# Patient Record
Sex: Female | Born: 1980 | Hispanic: No | Marital: Married | Smoking: Never smoker
Health system: Southern US, Community
[De-identification: ages and names within clinical notes are randomized; demographics above are authoritative.]

## PROBLEM LIST (undated history)

## (undated) HISTORY — PX: CHOLECYSTECTOMY: SHX55

---

## 2013-08-27 ENCOUNTER — Emergency Department (HOSPITAL_COMMUNITY)
Admission: EM | Admit: 2013-08-27 | Discharge: 2013-08-27 | Disposition: A | Discharge status: Home / Self Care | Payer: No Typology Code available for payment source | Source: Home / Self Care | Attending: Family Medicine | Admitting: Family Medicine

## 2013-08-27 ENCOUNTER — Encounter (HOSPITAL_COMMUNITY): Payer: Self-pay | Admitting: Emergency Medicine

## 2013-08-27 DIAGNOSIS — R519 Headache, unspecified: Secondary | ICD-10-CM

## 2013-08-27 DIAGNOSIS — R51 Headache: Secondary | ICD-10-CM

## 2013-08-27 MED ORDER — DEXAMETHASONE 1 MG/ML PO CONC
10.0000 mg | Freq: Once | ORAL | Status: AC
  Administered 2013-08-27: 10 mg via ORAL

## 2013-08-27 MED ORDER — SUMATRIPTAN SUCCINATE 6 MG/0.5ML ~~LOC~~ SOLN
6.0000 mg | Freq: Once | SUBCUTANEOUS | Status: AC
  Administered 2013-08-27: 6 mg via SUBCUTANEOUS

## 2013-08-27 MED ORDER — DEXAMETHASONE 10 MG/ML FOR PEDIATRIC ORAL USE
INTRAMUSCULAR | Status: AC
  Filled 2013-08-27: qty 1

## 2013-08-27 MED ORDER — SUMATRIPTAN SUCCINATE 6 MG/0.5ML ~~LOC~~ SOLN
SUBCUTANEOUS | Status: AC
  Filled 2013-08-27: qty 0.5

## 2013-08-27 NOTE — ED Notes (Addendum)
C/o headache and dizzines Denies any medication taking Denies any vomiting

## 2013-08-27 NOTE — Discharge Instructions (Signed)
You are likely suffering from a migraine.  This should improve significantly from the medications given in our clinic Please use tylenol and ibuprofen for future headaches. Tylenol 1000mg  every 8 hours and ibuprofen 600-800mg  every 6-8hours Please come back if your symptoms get worse.   Migraine Headache A migraine headache is an intense, throbbing pain on one or both sides of your head. A migraine can last for 30 minutes to several hours. CAUSES  The exact cause of a migraine headache is not always known. However, a migraine may be caused when nerves in the brain become irritated and release chemicals that cause inflammation. This causes pain. Certain things may also trigger migraines, such as:  Alcohol.  Smoking.  Stress.  Menstruation.  Aged cheeses.  Foods or drinks that contain nitrates, glutamate, aspartame, or tyramine.  Lack of sleep.  Chocolate.  Caffeine.  Hunger.  Physical exertion.  Fatigue.  Medicines used to treat chest pain (nitroglycerine), birth control pills, estrogen, and some blood pressure medicines. SIGNS AND SYMPTOMS  Pain on one or both sides of your head.  Pulsating or throbbing pain.  Severe pain that prevents daily activities.  Pain that is aggravated by any physical activity.  Nausea, vomiting, or both.  Dizziness.  Pain with exposure to bright lights, loud noises, or activity.  General sensitivity to bright lights, loud noises, or smells. Before you get a migraine, you may get warning signs that a migraine is coming (aura). An aura may include:  Seeing flashing lights.  Seeing bright spots, halos, or zigzag lines.  Having tunnel vision or blurred vision.  Having feelings of numbness or tingling.  Having trouble talking.  Having muscle weakness. DIAGNOSIS  A migraine headache is often diagnosed based on:  Symptoms.  Physical exam.  A CT scan or MRI of your head. These imaging tests cannot diagnose migraines, but they  can help rule out other causes of headaches. TREATMENT Medicines may be given for pain and nausea. Medicines can also be given to help prevent recurrent migraines.  HOME CARE INSTRUCTIONS  Only take over-the-counter or prescription medicines for pain or discomfort as directed by your health care provider. The use of long-term narcotics is not recommended.  Lie down in a dark, quiet room when you have a migraine.  Keep a journal to find out what may trigger your migraine headaches. For example, write down:  What you eat and drink.  How much sleep you get.  Any change to your diet or medicines.  Limit alcohol consumption.  Quit smoking if you smoke.  Get 7-9 hours of sleep, or as recommended by your health care provider.  Limit stress.  Keep lights dim if bright lights bother you and make your migraines worse. SEEK IMMEDIATE MEDICAL CARE IF:   Your migraine becomes severe.  You have a fever.  You have a stiff neck.  You have vision loss.  You have muscular weakness or loss of muscle control.  You start losing your balance or have trouble walking.  You feel faint or pass out.  You have severe symptoms that are different from your first symptoms. MAKE SURE YOU:   Understand these instructions.  Will watch your condition.  Will get help right away if you are not doing well or get worse. Document Released: 01/18/2005 Document Revised: 06/04/2013 Document Reviewed: 09/25/2012 [facility] Ltd[facility] Patient Information 2015 [place], [place]. This information is not intended to replace advice given to you by your health care provider. Make sure you discuss any questions  you have with your health care provider.

## 2013-08-27 NOTE — ED Provider Notes (Signed)
CSN: [id number]     Arrival date & time 08/27/13  1122 History   First MD Initiated Contact with Patient 08/27/13 1232     Chief Complaint  Patient presents with  . Headache   (Consider location/radiation/quality/duration/timing/severity/associated sxs/prior Treatment) HPI  HA: started 7 days ago. Typically in the temples. Alternating from R-L. Comes and goes. Does not wake at night. Unsure of what may make the HA worse. Improves w/ rest. Has not tried any medications. Associated w/ intermittent dizziness. Denies nausea, vomting, fever, rash, syncope, lightheadedness, falls, change in vision. H/o HA similar to this in the past.   History reviewed. No pertinent past medical history. Past Surgical History  Procedure Laterality Date  . Cholecystectomy     No family history on file. History  Substance Use Topics  . Smoking status: Never Smoker   . Smokeless tobacco: Not on file  . Alcohol Use: No   OB History   Grav Para Term Preterm Abortions TAB SAB Ect Mult Living                 Review of Systems Per HPI with all other pertinent systems negative.   Allergies  Review of patient's allergies indicates no known allergies.  Home Medications   Prior to Admission medications   Not on File   BP 136/72  Pulse 87  Temp(Src) 98.8 F (37.1 C) (Oral)  Resp 16  Ht 5\' 2"  (1.575 m)  Wt 172 lb (78.019 kg)  BMI 31.45 kg/m2  SpO2 99%  LMP 08/07/2013 Physical Exam  Constitutional: She is oriented to person, place, and time. She appears well-developed and well-nourished. No distress.  HENT:  Head: Normocephalic and atraumatic.  Eyes: EOM are normal. Pupils are equal, round, and reactive to light.  Neck: Normal range of motion.  Cardiovascular: Normal rate, regular rhythm, normal heart sounds and intact distal pulses.   No murmur heard. Pulmonary/Chest: Effort normal. No respiratory distress.  Abdominal: Bowel sounds are normal.  Musculoskeletal: Normal range of motion. She  exhibits no tenderness.  Neurological: She is alert and oriented to person, place, and time. No cranial nerve deficit. She exhibits normal muscle tone. Coordination normal.  Skin: Skin is warm and dry. No rash noted. She is not diaphoretic. No erythema. No pallor.  Psychiatric: She has a normal mood and affect. Her behavior is normal. Judgment and thought content normal.    ED Course  Procedures (including critical care time) Labs Review Labs Reviewed - No data to display  Imaging Review No results found.   MDM   1. Acute nonintractable headache, unspecified headache type    HA: likely migraine type. No sign of acute emergent intracranial process.  imitrex and decadron in clinic.   Tylenol and ibuprofen recommended PRN at home - counseled pt to try these therapies in the future as abortive measures at the onset of HA.  Precautions given and all questions answered   Shelly Flattenavid Merrell, MD Family Medicine 08/27/2013, 1:06 PM      Ozella Rocksavid J Merrell, MD 08/27/13 [phone]

## 2013-09-18 ENCOUNTER — Encounter: Payer: Self-pay | Admitting: Family Medicine

## 2013-09-18 ENCOUNTER — Ambulatory Visit (INDEPENDENT_AMBULATORY_CARE_PROVIDER_SITE_OTHER): Payer: No Typology Code available for payment source | Admitting: Family Medicine

## 2013-09-18 VITALS — BP 135/77 | HR 90 | Temp 98.4°F | Resp 14 | Ht 62.0 in | Wt 174.0 lb

## 2013-09-18 DIAGNOSIS — E8881 Metabolic syndrome: Secondary | ICD-10-CM

## 2013-09-18 DIAGNOSIS — Z789 Other specified health status: Secondary | ICD-10-CM | POA: Insufficient documentation

## 2013-09-18 DIAGNOSIS — N939 Abnormal uterine and vaginal bleeding, unspecified: Secondary | ICD-10-CM

## 2013-09-18 DIAGNOSIS — N926 Irregular menstruation, unspecified: Secondary | ICD-10-CM

## 2013-09-18 DIAGNOSIS — R109 Unspecified abdominal pain: Secondary | ICD-10-CM

## 2013-09-18 DIAGNOSIS — E669 Obesity, unspecified: Secondary | ICD-10-CM

## 2013-09-18 NOTE — Patient Instructions (Signed)
Start 1800 Calorie lowfat, low sodium diet divided over 6 small meals Increase water intake to 6-8 glasses daily Start aerobic exercise daily for 30 minutes 5 days per week Continue OTC Multivitamin (One A Day for Women) Continue daily folic acid

## 2013-09-18 NOTE — Progress Notes (Signed)
Subjective:    Patient ID: Lauren Whitehead, female    DOB: 08/12/1980, 33 y.o.   MRN: [id number]  HPI  Lauren Whitehead presents accompanied by an interpreter to establish care.  Patient states that she was followed by [facility] in [place] prior to moving to [place] 4 months ago. Her last complete physical exam with pap smear was in May.    Patient complains of irregular menstrual periods. Symptoms began several years ago. Patient describes symptoms of  bloating/fluid retention (mild) and pelvic pain (mild). Menstrual cycles range from 40 to 45 days apart.  Patient denies anxiety, breast tenderness, decreased libido, depression, dyspareunia, insomnia, labile mood and menstrual cramping. Evaluation to date includes GYN evaluation (normal). The patient is sexually active.    Patient alsocomplains of abdominal discomfort. The pain is located in the entire abdomen. The discomfort is described as bloating and occurs intermittently. Onset was several months ago. Symptoms have been improving. Aggravating factors include menstrual cycles and eating heavy meals. The patient denies fever, nausea, vomiting , and diarrhea.    Review of Systems  Constitutional: Positive for unexpected weight change (weight gain).  HENT: Negative.   Eyes: Negative.   Respiratory: Negative.   Cardiovascular: Negative.   Gastrointestinal: Positive for abdominal distention (ocasional bloating). Negative for nausea, diarrhea, constipation and rectal pain.  Endocrine: Negative.   Genitourinary: Positive for menstrual problem.  Musculoskeletal: Negative.   Skin: Negative.   Allergic/Immunologic: Negative.   Neurological: Negative.   Hematological: Negative.   Psychiatric/Behavioral: Negative.        Objective:   Physical Exam  Constitutional: She is oriented to person, place, and time. She appears well-developed and well-nourished.  HENT:  Head: Normocephalic and atraumatic.  Right Ear: External ear normal.  Eyes:  Conjunctivae are normal. Pupils are equal, round, and reactive to light.  Neck: Normal range of motion. Neck supple.  Cardiovascular: Regular rhythm.   Pulmonary/Chest: Effort normal and breath sounds normal. Tenderness:    Abdominal: Soft. Bowel sounds are normal. There is generalized tenderness. There is no rigidity, no guarding, no CVA tenderness, no tenderness at McBurney's point and negative Murphy's sign.  Musculoskeletal: Normal range of motion.  Neurological: She is alert and oriented to person, place, and time. She has normal reflexes.  Skin: Skin is warm and dry.  Psychiatric: She has a normal mood and affect. Her behavior is normal. Thought content normal.         BP 135/77  Pulse 90  Temp(Src) 98.4 F (36.9 C) (Oral)  Resp 14  Ht 5\' 2"  (1.575 m)  Wt 174 lb (78.926 kg)  BMI 31.82 kg/m2  LMP 08/26/2013 Assessment & Plan:   1. Obesity (BMI 30.0-34.9) Recommend that patient start an 1800 calorie lowfat, low carbohydrate diet divided over 6 small meals. Increase water intake to 6-8 glasses. Exercise 3-5 times per week, low impact aerobic.  - TSH  2. Abnormal menses She states that menstrual cycles have been occurring every 40-45 days, which has improved since younger years. Patient is sexually active. LMP 08/26/2013.  - TSH - Pregnancy, urine  3. Language barrier to communication Patient immigrated from [place]. She is accompanied by interpreter.   4. Abdominal discomfort Patient reports abdominal discomfort at times that was not reproducible during physical exam. She denies nausea, vomiting, diarrhea or constipation.  Will check CMP  5. Abdominal obesity-metabolic syndrome type 3 - TSH - Hemoglobin A1c - COMPLETE METABOLIC PANEL WITH GFR - Lipid Panel  Immunizations: Tetanus vaccination up to date  Pap Smear: 06/16/2013, was normal per documentation Labs: TSH, HgbA1C.  Last physical exam: March 2015 Last pap smear: 06/06/2013 LMP: 08/26/2013, currently   RTC:  3 months with Dr. Ashley RoyaltyMatthews for abdominal discomfort and abnormal menses

## 2013-09-19 ENCOUNTER — Telehealth: Payer: Self-pay | Admitting: Family Medicine

## 2013-09-19 ENCOUNTER — Telehealth: Payer: Self-pay

## 2013-09-19 LAB — COMPLETE METABOLIC PANEL WITH GFR
ALK PHOS: 92 U/L (ref 39–117)
ALT: 34 U/L (ref 0–35)
AST: 24 U/L (ref 0–37)
Albumin: 4.8 g/dL (ref 3.5–5.2)
BILIRUBIN TOTAL: 0.3 mg/dL (ref 0.2–1.2)
BUN: 6 mg/dL (ref 6–23)
CO2: 26 mEq/L (ref 19–32)
Calcium: 9.8 mg/dL (ref 8.4–10.5)
Chloride: 100 mEq/L (ref 96–112)
Creat: 0.6 mg/dL (ref 0.50–1.10)
GFR, Est African American: >89 mL/min
Glucose, Bld: 120 mg/dL — ABNORMAL HIGH (ref 70–99)
Potassium: 3.7 mEq/L (ref 3.5–5.3)
SODIUM: 135 meq/L (ref 135–145)
Total Protein: 8 g/dL (ref 6.0–8.3)

## 2013-09-19 LAB — HEMOGLOBIN A1C
HEMOGLOBIN A1C: 6 % — AB (ref <5.7)
Mean Plasma Glucose: 126 mg/dL — ABNORMAL HIGH (ref <117)

## 2013-09-19 LAB — LIPID PANEL
CHOL/HDL RATIO: 3.6 ratio
Cholesterol: 154 mg/dL (ref 0–200)
HDL: 43 mg/dL (ref >39)
LDL Cholesterol: 63 mg/dL (ref 0–99)
Triglycerides: 239 mg/dL — ABNORMAL HIGH (ref <150)
VLDL: 48 mg/dL — ABNORMAL HIGH (ref 0–40)

## 2013-09-19 LAB — PREGNANCY, URINE: PREG TEST UR: NEGATIVE

## 2013-09-19 LAB — TSH: TSH: 1.246 u[IU]/mL (ref 0.350–4.500)

## 2013-09-19 NOTE — Telephone Encounter (Signed)
Called and left message for patient to return call regarding lab results. Thanks!  

## 2013-09-19 NOTE — Telephone Encounter (Signed)
Reviewed labs. Patient to start a lowfat, low sodium diet divided over 6 small meals, increase water intake to 6-8 glasses, and start a daily exercise regimen as discussed during appointment. Will re-evaluate labs in 6 months.

## 2013-09-20 NOTE — Telephone Encounter (Signed)
Called and left message for patient to return call regarding lab results. Thanks!  

## 2013-09-21 NOTE — Telephone Encounter (Signed)
CALLED AND ADVISED PATIENT OF LAB RESULTS TO START LOWFAT, LOW SODIUM DIET TO DRINK 6-8 GLASSES OF WATER, PATIENT VERBALIZED UNDERSTANDING.

## 2013-12-17 ENCOUNTER — Ambulatory Visit: Payer: No Typology Code available for payment source | Admitting: Internal Medicine

## 2014-02-28 ENCOUNTER — Ambulatory Visit (INDEPENDENT_AMBULATORY_CARE_PROVIDER_SITE_OTHER): Payer: No Typology Code available for payment source | Admitting: Internal Medicine

## 2014-02-28 VITALS — BP 123/86 | HR 94 | Temp 98.2°F | Resp 16 | Ht 62.0 in | Wt 173.0 lb

## 2014-02-28 DIAGNOSIS — N915 Oligomenorrhea, unspecified: Secondary | ICD-10-CM

## 2014-02-28 DIAGNOSIS — R7303 Prediabetes: Secondary | ICD-10-CM

## 2014-02-28 DIAGNOSIS — R7309 Other abnormal glucose: Secondary | ICD-10-CM

## 2014-02-28 DIAGNOSIS — R5383 Other fatigue: Secondary | ICD-10-CM

## 2014-02-28 DIAGNOSIS — Z23 Encounter for immunization: Secondary | ICD-10-CM

## 2014-02-28 LAB — CBC WITH DIFFERENTIAL/PLATELET
BASOS ABS: 0 10*3/uL (ref 0.0–0.1)
Basophils Relative: 0 % (ref 0–1)
EOS PCT: 1 % (ref 0–5)
Eosinophils Absolute: 0.1 10*3/uL (ref 0.0–0.7)
HCT: 43.4 % (ref 36.0–46.0)
HEMOGLOBIN: 14.6 g/dL (ref 12.0–15.0)
LYMPHS ABS: 1.6 10*3/uL (ref 0.7–4.0)
Lymphocytes Relative: 19 % (ref 12–46)
MCH: 29.4 pg (ref 26.0–34.0)
MCHC: 33.6 g/dL (ref 30.0–36.0)
MCV: 87.5 fL (ref 78.0–100.0)
MONOS PCT: 4 % (ref 3–12)
MPV: 11.7 fL (ref 8.6–12.4)
Monocytes Absolute: 0.3 10*3/uL (ref 0.1–1.0)
NEUTROS PCT: 76 % (ref 43–77)
Neutro Abs: 6.4 10*3/uL (ref 1.7–7.7)
Platelets: 278 10*3/uL (ref 150–400)
RBC: 4.96 MIL/uL (ref 3.87–5.11)
RDW: 12.9 % (ref 11.5–15.5)
WBC: 8.4 10*3/uL (ref 4.0–10.5)

## 2014-02-28 NOTE — Progress Notes (Signed)
Patient ID: Lauren Whitehead, female   DOB: 19-Oct-1980, 34 y.o.   MRN: [id number]  CC: Irregular Periods and aftigue  HPI: Pt is here today with c/o continued oligomenorrhea and fatigue. She reports that her periods used to be regular however in the last 2 years she has had varied intervals between menses as much as 2 months. Sse denies vasomotor symptoms or weight loss. She has been having unprotected sexual activity with her husband in attempts for conception however this has so far failed. Her only other complaint is a feeling of generalized fatigue.   No Known Allergies No past medical history on file. No current outpatient prescriptions on file prior to visit.   No current facility-administered medications on file prior to visit.   No family history on file. History   Social History  . Marital Status: Married    Spouse Name: N/A    Number of Children: N/A  . Years of Education: N/A   Occupational History  . Not on file.   Social History Main Topics  . Smoking status: Never Smoker   . Smokeless tobacco: Not on file  . Alcohol Use: No  . Drug Use: No  . Sexual Activity: Not on file   Other Topics Concern  . Not on file   Social History Narrative  . No narrative on file    Review of Systems: Constitutional: Negative for fever, chills, diaphoresis, activity change, appetite change. HENT: Negative for ear pain, nosebleeds, congestion, facial swelling, rhinorrhea, neck pain, neck stiffness and ear discharge.  Eyes: Negative for pain, discharge, redness, itching and visual disturbance. Respiratory: Negative for cough, choking, chest tightness, shortness of breath, wheezing and stridor.  Cardiovascular: Negative for chest pain, palpitations and leg swelling. Gastrointestinal: Negative for abdominal distention. Genitourinary: Negative for dysuria, urgency, frequency, hematuria, flank pain, decreased urine volume, difficulty urinating and dyspareunia.  Musculoskeletal: Negative  for back pain, joint swelling, arthralgias and gait problem. Neurological: Negative for dizziness, tremors, seizures, syncope, facial asymmetry, speech difficulty, weakness, light-headedness, numbness and headaches.  Hematological: Negative for adenopathy. Does not bruise/bleed easily. Psychiatric/Behavioral: Negative for hallucinations, behavioral problems, confusion, dysphoric mood, decreased concentration and agitation.    Objective:   Filed Vitals:   02/28/14 0832  BP: 123/86  Pulse: 94  Temp: 98.2 F (36.8 C)  Resp: 16    Physical Exam: Constitutional: Patient appears well-developed and well-nourished. No distress. HENT: Normocephalic, atraumatic, External right and left ear normal. Oropharynx is clear and moist.  Eyes: Conjunctivae and EOM are normal. PERRLA, no scleral icterus. Neck: Normal ROM. Neck supple. No JVD. No tracheal deviation. No thyromegaly. CVS: RRR, S1/S2 +, no murmurs, no gallops, no carotid bruit.  Pulmonary: Effort and breath sounds normal, no stridor, rhonchi, wheezes, rales.  Abdominal: Soft. BS +,  no distension, tenderness, rebound or guarding.  Musculoskeletal: Normal range of motion. No edema and no tenderness.  Lymphadenopathy: No lymphadenopathy noted, cervical, inguinal or axillary Neuro: Alert. Normal reflexes, muscle tone coordination. No cranial nerve deficit. Skin: Skin is warm and dry. No rash noted. Not diaphoretic. No erythema. No pallor. Psychiatric: Normal mood and affect. Behavior, judgment, thought content normal.  No results found for: WBC, HGB, HCT, MCV, PLT Lab Results  Component Value Date   CREATININE 0.60 09/18/2013   BUN 6 09/18/2013   NA 135 09/18/2013   K 3.7 09/18/2013   CL 100 09/18/2013   CO2 26 09/18/2013    Lab Results  Component Value Date   HGBA1C 6.0* 09/18/2013  Lipid Panel     Component Value Date/Time   CHOL 154 09/18/2013 1404   TRIG 239* 09/18/2013 1404   HDL 43 09/18/2013 1404   CHOLHDL 3.6  09/18/2013 1404   VLDL 48* 09/18/2013 1404   LDLCALC 63 09/18/2013 1404       Assessment and plan:   Patient Active Problem List   Diagnosis Date Noted  . Pre-diabetes 02/28/2014  . Obesity (BMI 30.0-34.9) 09/18/2013  . Abnormal menses 09/18/2013  . Language barrier to communication 09/18/2013  . Abdominal discomfort 09/18/2013  . Abdominal obesity-metabolic syndrome type 3 09/18/2013      1. Hypomenorrhea/oligomenorrhea - Pt has pre-diabetes and facial hair. I will check a transvaginal U/S to evaluate for PCOS.  - US Transvaginal Non-OB; Future - Urinalysis  2. Immunization due - Flu Vaccine QUAD 36+ mos PF IM (Fluarix Quad PF)  3. Other fatigue - She c/o fatigue without any other features that point to any specific pathology. Her pre-diabetic states could certainly contribute to fatigue. Her TSH was normal and no evidence of metabolic derangement noted in labs. Will check a CBC to ensure no anemia. - CBC with Differential/Platelet  4. Pre-diabetes - I have discussed diet and exercise modulation of pre-diabetic state. Pt wants to try this for 6 months and if Hb A1c and TG still elevated will consider starting Glucophage ER 500 mg daily. This may also be contributing to oligomenorrhea.    MATTHEWS,MICHELLE A., MD [facility] [place], [place] [phone]  02/28/2014, 9:07 AM

## 2014-03-01 LAB — URINALYSIS
Bilirubin Urine: NEGATIVE
Glucose, UA: NEGATIVE mg/dL
HGB URINE DIPSTICK: NEGATIVE
Ketones, ur: NEGATIVE mg/dL
Leukocytes, UA: NEGATIVE
Nitrite: NEGATIVE
Protein, ur: NEGATIVE mg/dL
SPECIFIC GRAVITY, URINE: 1.005 (ref 1.005–1.030)
Urobilinogen, UA: 0.2 mg/dL (ref 0.0–1.0)
pH: 6.5 (ref 5.0–8.0)

## 2014-03-07 ENCOUNTER — Ambulatory Visit (HOSPITAL_COMMUNITY)
Admission: RE | Admit: 2014-03-07 | Discharge: 2014-03-07 | Disposition: A | Discharge status: Home / Self Care | Payer: No Typology Code available for payment source | Source: Ambulatory Visit | Attending: Internal Medicine | Admitting: Internal Medicine

## 2014-03-07 DIAGNOSIS — N915 Oligomenorrhea, unspecified: Secondary | ICD-10-CM | POA: Insufficient documentation

## 2014-04-08 ENCOUNTER — Ambulatory Visit (INDEPENDENT_AMBULATORY_CARE_PROVIDER_SITE_OTHER): Payer: No Typology Code available for payment source | Admitting: Internal Medicine

## 2014-04-08 ENCOUNTER — Other Ambulatory Visit: Payer: Self-pay | Admitting: Internal Medicine

## 2014-04-08 VITALS — BP 139/68 | HR 86 | Temp 98.2°F | Resp 16 | Ht 62.0 in | Wt 171.0 lb

## 2014-04-08 DIAGNOSIS — T50905A Adverse effect of unspecified drugs, medicaments and biological substances, initial encounter: Secondary | ICD-10-CM

## 2014-04-08 DIAGNOSIS — E282 Polycystic ovarian syndrome: Secondary | ICD-10-CM | POA: Insufficient documentation

## 2014-04-08 MED ORDER — METFORMIN HCL 500 MG PO TABS: 500.0000 mg | ORAL_TABLET | Freq: Three times a day (TID) | ORAL | Status: AC

## 2014-04-08 NOTE — Patient Instructions (Signed)
Polycystic Ovarian Syndrome  Polycystic ovarian syndrome (PCOS) is a common hormonal disorder among women of reproductive age. Most women with PCOS grow many small cysts on their ovaries. PCOS can cause problems with your periods and make it difficult to get pregnant. It can also cause an increased risk of miscarriage with pregnancy. If left untreated, PCOS can lead to serious health problems, such as diabetes and heart disease.  CAUSES  The cause of PCOS is not fully understood, but genetics may be a factor.  SIGNS AND SYMPTOMS    Infrequent or no menstrual periods.    Inability to get pregnant (infertility) because of not ovulating.    Increased growth of hair on the face, chest, stomach, back, thumbs, thighs, or toes.    Acne, oily skin, or dandruff.    Pelvic pain.    Weight gain or obesity, usually carrying extra weight around the waist.    Type 2 diabetes.    High cholesterol.    High blood pressure.    Female-pattern baldness or thinning hair.    Patches of thickened and dark brown or black skin on the neck, arms, breasts, or thighs.    Tiny excess flaps of skin (skin tags) in the armpits or neck area.    Excessive snoring and having breathing stop at times while asleep (sleep apnea).    Deepening of the voice.    Gestational diabetes when pregnant.   DIAGNOSIS   There is no single test to diagnose PCOS.    Your health care provider will:    Take a medical history.    Perform a pelvic exam.    Have ultrasonography done.    Check your female and female hormone levels.    Measure glucose or sugar levels in the blood.    Do other blood tests.    If you are producing too many female hormones, your health care provider will make sure it is from PCOS. At the physical exam, your health care provider will want to evaluate the areas of increased hair growth. Try to allow natural hair growth for a few days before the visit.    During a pelvic exam, the ovaries may be  enlarged or swollen because of the increased number of small cysts. This can be seen more easily by using vaginal ultrasonography or screening to examine the ovaries and lining of the uterus (endometrium) for cysts. The uterine lining may become thicker if you have not been having a regular period.   TREATMENT   Because there is no cure for PCOS, it needs to be managed to prevent problems. Treatments are based on your symptoms. Treatment is also based on whether you want to have a baby or whether you need contraception.   Treatment may include:    Progesterone hormone to start a menstrual period.    Birth control pills to make you have regular menstrual periods.    Medicines to make you ovulate, if you want to get pregnant.    Medicines to control your insulin.    Medicine to control your blood pressure.    Medicine and diet to control your high cholesterol and triglycerides in your blood.   Medicine to reduce excessive hair growth.   Surgery, making small holes in the ovary, to decrease the amount of female hormone production. This is done through a long, lighted tube (laparoscope) placed into the pelvis through a tiny incision in the lower abdomen.   HOME CARE INSTRUCTIONS   Only   take over-the-counter or prescription medicine as directed by your health care provider.   Pay attention to the foods you eat and your activity levels. This can help reduce the effects of PCOS.   Keep your weight under control.   Eat foods that are low in carbohydrate and high in fiber.   Exercise regularly.  SEEK MEDICAL CARE IF:   Your symptoms do not get better with medicine.   You have new symptoms.  Document Released: 05/14/2004 Document Revised: 11/08/2012 Document Reviewed: 07/06/2012  ExitCare Patient Information 2015 [employer]. This information is not intended to replace advice given to you by your health care provider. Make sure you discuss any questions you have with your health care provider.

## 2014-04-08 NOTE — Progress Notes (Signed)
Subjective:     Patient ID: Lauren Whitehead, female   DOB: [birth year], 34 y.o.   MRN: [id number]  HPI: Pt here today to discuss results of transvagnal ultrasound. Pt has been attempting ot conceive but has been unable to. She was evaluated for PCOS as a cause of failed conception and U/S showed findings consistent with PCOS. I have discussed starting Glucophage and patient is agreeable. She has been advised that she should not become pregnant while taking Glucophage due to it's teratogenic effects. She verbalized understanding.    Review of Systems  Constitutional: Negative.  Negative for fever, chills, activity change, appetite change and fatigue.  HENT: Negative.  Negative for dental problem, ear pain, hearing loss, sore throat and trouble swallowing.   Eyes: Negative.  Negative for visual disturbance.  Respiratory: Negative.  Negative for cough, chest tightness, shortness of breath and wheezing.   Cardiovascular: Negative.  Negative for chest pain, palpitations and leg swelling.  Gastrointestinal: Negative.  Negative for abdominal pain, diarrhea, constipation, blood in stool, anal bleeding and rectal pain.  Endocrine: Negative.  Negative for cold intolerance, heat intolerance, polydipsia, polyphagia and polyuria.  Genitourinary: Negative.  Negative for dysuria, urgency, frequency, vaginal discharge, genital sores, vaginal pain, menstrual problem and dyspareunia.  Musculoskeletal: Negative.   Skin: Negative.   Allergic/Immunologic: Negative.  Negative for environmental allergies.  Neurological: Negative.  Negative for dizziness, tremors, weakness and headaches.  Hematological: Negative.   Psychiatric/Behavioral: Negative.   All other systems reviewed and are negative.      Objective:   Physical Exam  Constitutional: She is oriented to person, place, and time. She appears well-developed and well-nourished.  Eyes: Conjunctivae and EOM are normal. Pupils are equal, round, and reactive to  light.  Neck: Neck supple.  Cardiovascular: Normal rate and regular rhythm.  Exam reveals no gallop and no friction rub.   No murmur heard. Pulmonary/Chest: Effort normal and breath sounds normal. She has no wheezes. She has no rales.  Abdominal: Soft. Bowel sounds are normal.  Neurological: She is oriented to person, place, and time.  Psychiatric: She has a normal mood and affect. Her behavior is normal. Judgment and thought content normal.       Assessment:     1. PCOS (polycystic ovarian syndrome) - Transvaginal U/S showed findings consistent with PCOS. Will start on Glucophage TID and refer to endocrinology. - metFORMIN (GLUCOPHAGE) 500 MG tablet; Take 1 tablet (500 mg total) by mouth 3 (three) times daily with meals.  Dispense: 90 tablet; Refill: 3 - Ambulatory referral to Endocrinology      Plan:     - See above.

## 2014-05-03 ENCOUNTER — Other Ambulatory Visit (INDEPENDENT_AMBULATORY_CARE_PROVIDER_SITE_OTHER): Payer: No Typology Code available for payment source

## 2014-05-03 DIAGNOSIS — T887XXA Unspecified adverse effect of drug or medicament, initial encounter: Secondary | ICD-10-CM

## 2014-05-03 DIAGNOSIS — T50905A Adverse effect of unspecified drugs, medicaments and biological substances, initial encounter: Secondary | ICD-10-CM

## 2014-05-04 LAB — BASIC METABOLIC PANEL
BUN: 9 mg/dL (ref 6–23)
CO2: 23 mEq/L (ref 19–32)
CREATININE: 0.61 mg/dL (ref 0.50–1.10)
Calcium: 9.4 mg/dL (ref 8.4–10.5)
Chloride: 104 mEq/L (ref 96–112)
GLUCOSE: 99 mg/dL (ref 70–99)
Potassium: 3.9 mEq/L (ref 3.5–5.3)
Sodium: 138 mEq/L (ref 135–145)

## 2014-05-09 ENCOUNTER — Ambulatory Visit (INDEPENDENT_AMBULATORY_CARE_PROVIDER_SITE_OTHER): Payer: No Typology Code available for payment source | Admitting: Internal Medicine

## 2014-05-09 VITALS — BP 140/74 | HR 92 | Temp 98.6°F | Resp 16 | Ht 62.0 in | Wt 169.0 lb

## 2014-05-09 DIAGNOSIS — K219 Gastro-esophageal reflux disease without esophagitis: Secondary | ICD-10-CM | POA: Diagnosis not present

## 2014-05-09 DIAGNOSIS — E282 Polycystic ovarian syndrome: Secondary | ICD-10-CM | POA: Diagnosis not present

## 2014-05-09 MED ORDER — OMEPRAZOLE 40 MG PO CPDR: 40.0000 mg | DELAYED_RELEASE_CAPSULE | Freq: Every day | ORAL | Status: AC

## 2014-05-31 ENCOUNTER — Encounter: Payer: Self-pay | Admitting: Family Medicine

## 2014-05-31 ENCOUNTER — Ambulatory Visit (INDEPENDENT_AMBULATORY_CARE_PROVIDER_SITE_OTHER): Payer: No Typology Code available for payment source | Admitting: Family Medicine

## 2014-05-31 VITALS — BP 132/85 | HR 96 | Temp 97.8°F | Ht 62.0 in | Wt 166.0 lb

## 2014-05-31 DIAGNOSIS — B373 Candidiasis of vulva and vagina: Secondary | ICD-10-CM

## 2014-05-31 DIAGNOSIS — L298 Other pruritus: Secondary | ICD-10-CM

## 2014-05-31 DIAGNOSIS — N898 Other specified noninflammatory disorders of vagina: Secondary | ICD-10-CM | POA: Diagnosis not present

## 2014-05-31 DIAGNOSIS — B3731 Acute candidiasis of vulva and vagina: Secondary | ICD-10-CM

## 2014-05-31 LAB — POCT WET PREP (WET MOUNT): Clue Cells Wet Prep Whiff POC: NEGATIVE

## 2014-05-31 MED ORDER — FLUCONAZOLE 200 MG PO TABS: 200.0000 mg | ORAL_TABLET | Freq: Every day | ORAL | Status: DC

## 2014-05-31 NOTE — Patient Instructions (Addendum)
Start Over the counter Miconizole (Monistat cream); apply to external vaginal tissue twice daily as needed for 5 days.  Candida Infection A Candida infection (also called yeast, fungus, and Monilia infection) is an overgrowth of yeast that can occur anywhere on the body. A yeast infection commonly occurs in warm, moist body areas. Usually, the infection remains localized but can spread to become a systemic infection. A yeast infection may be a sign of a more severe disease such as diabetes, leukemia, or AIDS. A yeast infection can occur in both men and women. In women, Candida vaginitis is a vaginal infection. It is one of the most common causes of vaginitis. Men usually do not have symptoms or know they have an infection until other problems develop. Men may find out they have a yeast infection because their sex partner has a yeast infection. Uncircumcised men are more likely to get a yeast infection than circumcised men. This is because the uncircumcised glans is not exposed to air and does not remain as dry as that of a circumcised glans. Older adults may develop yeast infections around dentures. CAUSES  Women  Antibiotics.  Steroid medication taken for a long time.  Being overweight (obese).  Diabetes.  Poor immune condition.  Certain serious medical conditions.  Immune suppressive medications for organ transplant patients.  Chemotherapy.  Pregnancy.  Menstruation.  Stress and fatigue.  Intravenous drug use.  Oral contraceptives.  Wearing tight-fitting clothes in the crotch area.  Catching it from a sex partner who has a yeast infection.  Spermicide.  Intravenous, urinary, or other catheters. Men  Catching it from a sex partner who has a yeast infection.  Having oral or anal sex with a person who has the infection.  Spermicide.  Diabetes.  Antibiotics.  Poor immune system.  Medications that suppress the immune system.  Intravenous drug  use.  Intravenous, urinary, or other catheters. SYMPTOMS  Women  Thick, white vaginal discharge.  Vaginal itching.  Redness and swelling in and around the vagina.  Irritation of the lips of the vagina and perineum.  Blisters on the vaginal lips and perineum.  Painful sexual intercourse.  Low blood sugar (hypoglycemia).  Painful urination.  Bladder infections.  Intestinal problems such as constipation, indigestion, bad breath, bloating, increase in gas, diarrhea, or loose stools. Men  Men may develop intestinal problems such as constipation, indigestion, bad breath, bloating, increase in gas, diarrhea, or loose stools.  Dry, cracked skin on the penis with itching or discomfort.  Jock itch.  Dry, flaky skin.  Athlete's foot.  Hypoglycemia. DIAGNOSIS  Women  A history and an exam are performed.  The discharge may be examined under a microscope.  A culture may be taken of the discharge. Men  A history and an exam are performed.  Any discharge from the penis or areas of cracked skin will be looked at under the microscope and cultured.  Stool samples may be cultured. TREATMENT  Women  Vaginal antifungal suppositories and creams.  Medicated creams to decrease irritation and itching on the outside of the vagina.  Warm compresses to the perineal area to decrease swelling and discomfort.  Oral antifungal medications.  Medicated vaginal suppositories or cream for repeated or recurrent infections.  Wash and dry the irritation areas before applying the cream.  Eating yogurt with Lactobacillus may help with prevention and treatment.  Sometimes painting the vagina with gentian violet solution may help if creams and suppositories do not work. Men  Antifungal creams and oral antifungal  medications.  Sometimes treatment must continue for 30 days after the symptoms go away to prevent recurrence. HOME CARE INSTRUCTIONS  Women  Use cotton underwear and avoid  tight-fitting clothing.  Avoid colored, scented toilet paper and deodorant tampons or pads.  Do not douche.  Keep your diabetes under control.  Finish all the prescribed medications.  Keep your skin clean and dry.  Consume milk or yogurt with Lactobacillus-active culture regularly. If you get frequent yeast infections and think that is what the infection is, there are over-the-counter medications that you can get. If the infection does not show healing in 3 days, talk to your caregiver.  Tell your sex partner you have a yeast infection. Your partner may need treatment also, especially if your infection does not clear up or recurs. Men  Keep your skin clean and dry.  Keep your diabetes under control.  Finish all prescribed medications.  Tell your sex partner that you have a yeast infection so he or she can be treated if necessary. SEEK MEDICAL CARE IF:   Your symptoms do not clear up or worsen in one week after treatment.  You have an oral temperature above 102 F (38.9 C).  You have trouble swallowing or eating for a prolonged time.  You develop blisters on and around your vagina.  You develop vaginal bleeding and it is not your menstrual period.  You develop abdominal pain.  You develop intestinal problems as mentioned above.  You get weak or light-headed.  You have painful or increased urination.  You have pain during sexual intercourse. MAKE SURE YOU:   Understand these instructions.  Will watch your condition.  Will get help right away if you are not doing well or get worse. Document Released: 02/26/2004 Document Revised: 06/04/2013 Document Reviewed: 06/09/2009 [facility] Patient Information 2015 [place]Care, [place]. This information is not intended to replace advice given to you by your health care provider. Make sure you discuss any questions you have with your health care provider.

## 2014-05-31 NOTE — Progress Notes (Signed)
   Subjective:    Patient ID: Lauren Whitehead, female    DOB: 08/11/1980, 34 y.o.   MRN: [id number]  HPI  Lauren Whitehead, a 34 year old female presents with vaginal itching and discharge for 2 weeks. Vulvar symptoms include discharge described as white. Patient denies changing sexual partners recently. She states that she has not tried new soaps, lotions, or body washes. Patient reports external itching, but denies fever, fatigue, dyspareunia, vaginal itching, burning, or dysuria. Patient is not utilizing contraception. Patient has not attempted any over the counter interventions to alleviate symptoms.   No past medical history on file.  History   Social History  . Marital Status: Married    Spouse Name: N/A  . Number of Children: N/A  . Years of Education: N/A   Occupational History  . Not on file.   Social History Main Topics  . Smoking status: Never Smoker   . Smokeless tobacco: Never Used  . Alcohol Use: No  . Drug Use: No  . Sexual Activity: Not Currently   Other Topics Concern  . Not on file   Social History Narrative  No Known Allergies Review of Systems  Constitutional: Negative.   HENT: Negative.   Eyes: Negative.   Respiratory: Negative.   Cardiovascular: Negative.   Gastrointestinal: Negative.   Endocrine: Negative.   Genitourinary: Positive for vaginal discharge (vaginal itching).  Musculoskeletal: Negative.   Skin: Negative.   Allergic/Immunologic: Negative.   Neurological: Negative.   Hematological: Negative.   Psychiatric/Behavioral: Negative.        Objective:   Physical Exam  Constitutional: She is oriented to person, place, and time.  HENT:  Head: Normocephalic and atraumatic.  Right Ear: External ear normal.  Left Ear: External ear normal.  Mouth/Throat: Oropharynx is clear and moist.  Eyes: Conjunctivae are normal. Pupils are equal, round, and reactive to light.  Neck: Normal range of motion. Neck supple.  Cardiovascular: Normal rate,  regular rhythm, normal heart sounds and intact distal pulses.   Pulmonary/Chest: Effort normal and breath sounds normal.  Abdominal: Soft. Bowel sounds are normal.  Genitourinary: Cervix exhibits discharge (thick, white discharge).  Musculoskeletal: Normal range of motion.  Neurological: She is alert and oriented to person, place, and time. She has normal reflexes.  Skin: Skin is warm and dry.  Psychiatric: She has a normal mood and affect. Her behavior is normal. Judgment and thought content normal.         BP 132/85 mmHg  Pulse 96  Temp(Src) 97.8 F (36.6 C) (Oral)  Ht 5\' 2"  (1.575 m)  Wt 166 lb (75.297 kg)  BMI 30.35 kg/m2  LMP 05/03/2014  Assessment & Plan:  1. Yeast infection of the vagina Yeast visualized on microscopic examination. 2-5 per HPF. Will start Diflucan. Patient given written information on vaginal yeast infections, she expressed understanding.  - fluconazole (DIFLUCAN) 200 MG tablet; Take 1 tablet (200 mg total) by mouth daily.  Dispense: 2 tablet; Refill: 0  2. Vaginal itching - POCT Wet Prep [facility]([place] [place])  3. Vaginal discharge  - POCT Wet Prep [facility] [place]   [place],Jeet Shough M, OregonFNP

## 2014-06-04 ENCOUNTER — Encounter: Payer: Self-pay | Admitting: Internal Medicine

## 2014-06-04 NOTE — Progress Notes (Signed)
Patient ID: Lauren Whitehead, female   DOB: Jun 02, 1980, 34 y.o.   MRN: [id number]  CC: Reflux  HPI: Pt is here with c/o reflux symptoms. She does not speak fluent english and is here today with an interpreter.  Pt was recently diagnosed with PCOS and was started on Glucophage. She has had her 1st menses since starting on Glucophage. I have counseled patietn that she should be on contraception while on Glucophage due to the teratogenic effects of Glucophage but she declines even though she understands the risks. She has been referred to Endocrinology.   Today she has c/o "heartburn". She states that it is particularly prominent when she eats spicy foods.  She had been on a medicatioonI some time in the past and felt that it was effective. However she does not remember the name of the medication.   No Known Allergies No past medical history on file. Current Outpatient Prescriptions on File Prior to Visit  Medication Sig Dispense Refill  . metFORMIN (GLUCOPHAGE) 500 MG tablet Take 1 tablet (500 mg total) by mouth 3 (three) times daily with meals. 90 tablet 3   No current facility-administered medications on file prior to visit.   No family history on file. History   Social History  . Marital Status: Married    Spouse Name: N/A  . Number of Children: N/A  . Years of Education: N/A   Occupational History  . Not on file.   Social History Main Topics  . Smoking status: Never Smoker   . Smokeless tobacco: Never Used  . Alcohol Use: No  . Drug Use: No  . Sexual Activity: Not Currently   Other Topics Concern  . Not on file   Social History Narrative    Review of Systems: Constitutional: Negative for fever, chills, diaphoresis, activity change, appetite change and fatigue. HENT: Negative for ear pain, nosebleeds, congestion, facial swelling, rhinorrhea, neck pain, neck stiffness and ear discharge.  Eyes: Negative for pain, discharge, redness, itching and visual  disturbance. Respiratory: Negative for cough, choking, chest tightness, shortness of breath, wheezing and stridor.  Cardiovascular: Negative for chest pain, palpitations and leg swelling. Genitourinary: Negative for dysuria, urgency, frequency, hematuria, flank pain, decreased urine volume, difficulty urinating and dyspareunia.  Musculoskeletal: Negative for back pain, joint swelling, arthralgias and gait problem. Neurological: Negative for dizziness, tremors, seizures, syncope, facial asymmetry, speech difficulty, weakness, light-headedness, numbness and headaches.  Hematological: Negative for adenopathy. Does not bruise/bleed easily. Psychiatric/Behavioral: Negative for hallucinations, behavioral problems, confusion, dysphoric mood, decreased concentration and agitation.    Objective:   Filed Vitals:   05/09/14 1556  BP: 140/74  Pulse: 92  Temp: 98.6 F (37 C)  Resp: 16    Physical Exam: Constitutional: Patient appears well-developed and well-nourished. No distress. HENT: Normocephalic, atraumatic, External right and left ear normal. Oropharynx is clear and moist.  Eyes: Conjunctivae and EOM are normal. PERRLA, no scleral icterus. Neck: Normal ROM. Neck supple. No JVD. No tracheal deviation. No thyromegaly. CVS: RRR, S1/S2 +, no murmurs, no gallops, no carotid bruit.  Pulmonary: Effort and breath sounds normal, no stridor, rhonchi, wheezes, rales.  Abdominal: Soft. BS +,  no distension, tenderness, rebound or guarding.  Musculoskeletal: Normal range of motion. No edema and no tenderness.  Lymphadenopathy: No lymphadenopathy noted, cervical, inguinal or axillary Neuro: Alert. Normal reflexes, muscle tone coordination. No cranial nerve deficit. Skin: Skin is warm and dry. No rash noted. Not diaphoretic. No erythema. No pallor. Psychiatric: Normal mood and affect. Behavior, judgment, thought content  normal.  Lab Results  Component Value Date   WBC 8.4 02/28/2014   HGB 14.6  02/28/2014   HCT 43.4 02/28/2014   MCV 87.5 02/28/2014   PLT 278 02/28/2014   Lab Results  Component Value Date   CREATININE 0.61 05/03/2014   BUN 9 05/03/2014   NA 138 05/03/2014   K 3.9 05/03/2014   CL 104 05/03/2014   CO2 23 05/03/2014    Lab Results  Component Value Date   HGBA1C 6.0* 09/18/2013   Lipid Panel     Component Value Date/Time   CHOL 154 09/18/2013 1404   TRIG 239* 09/18/2013 1404   HDL 43 09/18/2013 1404   CHOLHDL 3.6 09/18/2013 1404   VLDL 48* 09/18/2013 1404   LDLCALC 63 09/18/2013 1404       Assessment and plan:   1. Gastroesophageal reflux disease without esophagitis - Will start on Prilosec adn follow up in 8 weeks. If still having heartburn will refer for EGD. - omeprazole (PRILOSEC) 40 MG capsule; Take 1 capsule (40 mg total) by mouth daily.  Dispense: 30 capsule; Refill: 3  2. PCOS (polycystic ovarian syndrome) - Referral sent to Endocrinology last month. Will follow up with progress.  RTC as needed or in 12 weeks for Annual Physical and re-evaluation of Reflux.  MATTHEWS,MICHELLE A., MD [facility] [place], [place] [phone]  06/04/2014, 7:55 PM

## 2014-06-04 NOTE — Patient Instructions (Signed)

## 2014-07-12 ENCOUNTER — Ambulatory Visit (INDEPENDENT_AMBULATORY_CARE_PROVIDER_SITE_OTHER): Payer: No Typology Code available for payment source | Admitting: Family Medicine

## 2014-07-12 ENCOUNTER — Other Ambulatory Visit (HOSPITAL_COMMUNITY)
Admission: RE | Admit: 2014-07-12 | Discharge: 2014-07-12 | Disposition: A | Discharge status: Home / Self Care | Payer: No Typology Code available for payment source | Source: Ambulatory Visit | Attending: Family Medicine | Admitting: Family Medicine

## 2014-07-12 ENCOUNTER — Encounter: Payer: Self-pay | Admitting: Family Medicine

## 2014-07-12 VITALS — BP 129/81 | HR 91 | Temp 98.2°F | Resp 16 | Ht 62.0 in | Wt 163.0 lb

## 2014-07-12 DIAGNOSIS — L298 Other pruritus: Secondary | ICD-10-CM

## 2014-07-12 DIAGNOSIS — A499 Bacterial infection, unspecified: Secondary | ICD-10-CM

## 2014-07-12 DIAGNOSIS — Z01419 Encounter for gynecological examination (general) (routine) without abnormal findings: Secondary | ICD-10-CM | POA: Insufficient documentation

## 2014-07-12 DIAGNOSIS — R7309 Other abnormal glucose: Secondary | ICD-10-CM

## 2014-07-12 DIAGNOSIS — N949 Unspecified condition associated with female genital organs and menstrual cycle: Secondary | ICD-10-CM | POA: Diagnosis not present

## 2014-07-12 DIAGNOSIS — N898 Other specified noninflammatory disorders of vagina: Secondary | ICD-10-CM

## 2014-07-12 DIAGNOSIS — B3731 Acute candidiasis of vulva and vagina: Secondary | ICD-10-CM

## 2014-07-12 DIAGNOSIS — Z113 Encounter for screening for infections with a predominantly sexual mode of transmission: Secondary | ICD-10-CM | POA: Insufficient documentation

## 2014-07-12 DIAGNOSIS — N760 Acute vaginitis: Secondary | ICD-10-CM

## 2014-07-12 DIAGNOSIS — R7303 Prediabetes: Secondary | ICD-10-CM

## 2014-07-12 DIAGNOSIS — B373 Candidiasis of vulva and vagina: Secondary | ICD-10-CM

## 2014-07-12 DIAGNOSIS — B9689 Other specified bacterial agents as the cause of diseases classified elsewhere: Secondary | ICD-10-CM

## 2014-07-12 LAB — POCT URINALYSIS DIP (DEVICE)
BILIRUBIN URINE: NEGATIVE
GLUCOSE, UA: NEGATIVE mg/dL
Ketones, ur: NEGATIVE mg/dL
LEUKOCYTES UA: NEGATIVE
Nitrite: NEGATIVE
PH: 5.5 (ref 5.0–8.0)
Protein, ur: NEGATIVE mg/dL
SPECIFIC GRAVITY, URINE: 1.02 (ref 1.005–1.030)
Urobilinogen, UA: 0.2 mg/dL (ref 0.0–1.0)

## 2014-07-12 LAB — BASIC METABOLIC PANEL
BUN: 11 mg/dL (ref 6–23)
CO2: 22 mEq/L (ref 19–32)
Calcium: 9.3 mg/dL (ref 8.4–10.5)
Chloride: 104 mEq/L (ref 96–112)
Creat: 0.6 mg/dL (ref 0.50–1.10)
Glucose, Bld: 118 mg/dL — ABNORMAL HIGH (ref 70–99)
Potassium: 3.7 mEq/L (ref 3.5–5.3)
SODIUM: 141 meq/L (ref 135–145)

## 2014-07-12 LAB — POCT WET PREP (WET MOUNT)

## 2014-07-12 LAB — HEMOGLOBIN A1C
Hgb A1c MFr Bld: 5.5 % (ref <5.7)
Mean Plasma Glucose: 111 mg/dL (ref <117)

## 2014-07-12 LAB — GLUCOSE, CAPILLARY: Glucose-Capillary: 132 mg/dL — ABNORMAL HIGH (ref 65–99)

## 2014-07-12 MED ORDER — FLUCONAZOLE 200 MG PO TABS: 200.0000 mg | ORAL_TABLET | Freq: Every day | ORAL | Status: AC

## 2014-07-12 MED ORDER — METRONIDAZOLE 500 MG PO TABS: 500.0000 mg | ORAL_TABLET | Freq: Two times a day (BID) | ORAL | Status: AC

## 2014-07-12 NOTE — Patient Instructions (Signed)
Candida Infection A Candida infection (also called yeast, fungus, and Monilia infection) is an overgrowth of yeast that can occur anywhere on the body. A yeast infection commonly occurs in warm, moist body areas. Usually, the infection remains localized but can spread to become a systemic infection. A yeast infection may be a sign of a more severe disease such as diabetes, leukemia, or AIDS. A yeast infection can occur in both men and women. In women, Candida vaginitis is a vaginal infection. It is one of the most common causes of vaginitis. Men usually do not have symptoms or know they have an infection until other problems develop. Men may find out they have a yeast infection because their sex partner has a yeast infection. Uncircumcised men are more likely to get a yeast infection than circumcised men. This is because the uncircumcised glans is not exposed to air and does not remain as dry as that of a circumcised glans. Older adults may develop yeast infections around dentures. CAUSES  Women  Antibiotics.  Steroid medication taken for a long time.  Being overweight (obese).  Diabetes.  Poor immune condition.  Certain serious medical conditions.  Immune suppressive medications for organ transplant patients.  Chemotherapy.  Pregnancy.  Menstruation.  Stress and fatigue.  Intravenous drug use.  Oral contraceptives.  Wearing tight-fitting clothes in the crotch area.  Catching it from a sex partner who has a yeast infection.  Spermicide.  Intravenous, urinary, or other catheters. Men  Catching it from a sex partner who has a yeast infection.  Having oral or anal sex with a person who has the infection.  Spermicide.  Diabetes.  Antibiotics.  Poor immune system.  Medications that suppress the immune system.  Intravenous drug use.  Intravenous, urinary, or other catheters. SYMPTOMS  Women  Thick, white vaginal discharge.  Vaginal itching.  Redness and  swelling in and around the vagina.  Irritation of the lips of the vagina and perineum.  Blisters on the vaginal lips and perineum.  Painful sexual intercourse.  Low blood sugar (hypoglycemia).  Painful urination.  Bladder infections.  Intestinal problems such as constipation, indigestion, bad breath, bloating, increase in gas, diarrhea, or loose stools. Men  Men may develop intestinal problems such as constipation, indigestion, bad breath, bloating, increase in gas, diarrhea, or loose stools.  Dry, cracked skin on the penis with itching or discomfort.  Jock itch.  Dry, flaky skin.  Athlete's foot.  Hypoglycemia. DIAGNOSIS  Women  A history and an exam are performed.  The discharge may be examined under a microscope.  A culture may be taken of the discharge. Men  A history and an exam are performed.  Any discharge from the penis or areas of cracked skin will be looked at under the microscope and cultured.  Stool samples may be cultured. TREATMENT  Women  Vaginal antifungal suppositories and creams.  Medicated creams to decrease irritation and itching on the outside of the vagina.  Warm compresses to the perineal area to decrease swelling and discomfort.  Oral antifungal medications.  Medicated vaginal suppositories or cream for repeated or recurrent infections.  Wash and dry the irritation areas before applying the cream.  Eating yogurt with Lactobacillus may help with prevention and treatment.  Sometimes painting the vagina with gentian violet solution may help if creams and suppositories do not work. Men  Antifungal creams and oral antifungal medications.  Sometimes treatment must continue for 30 days after the symptoms go away to prevent recurrence. HOME CARE INSTRUCTIONS    Women  Use cotton underwear and avoid tight-fitting clothing.  Avoid colored, scented toilet paper and deodorant tampons or pads.  Do not douche.  Keep your diabetes  under control.  Finish all the prescribed medications.  Keep your skin clean and dry.  Consume milk or yogurt with Lactobacillus-active culture regularly. If you get frequent yeast infections and think that is what the infection is, there are over-the-counter medications that you can get. If the infection does not show healing in 3 days, talk to your caregiver.  Tell your sex partner you have a yeast infection. Your partner may need treatment also, especially if your infection does not clear up or recurs. Men  Keep your skin clean and dry.  Keep your diabetes under control.  Finish all prescribed medications.  Tell your sex partner that you have a yeast infection so he or she can be treated if necessary. SEEK MEDICAL CARE IF:   Your symptoms do not clear up or worsen in one week after treatment.  You have an oral temperature above 102 F (38.9 C).  You have trouble swallowing or eating for a prolonged time.  You develop blisters on and around your vagina.  You develop vaginal bleeding and it is not your menstrual period.  You develop abdominal pain.  You develop intestinal problems as mentioned above.  You get weak or light-headed.  You have painful or increased urination.  You have pain during sexual intercourse. MAKE SURE YOU:   Understand these instructions.  Will watch your condition.  Will get help right away if you are not doing well or get worse. Document Released: 02/26/2004 Document Revised: 06/04/2013 Document Reviewed: 06/09/2009 ExitCare Patient Information 2015 ExitCare, LLC. This information is not intended to replace advice given to you by your health care provider. Make sure you discuss any questions you have with your health care provider. Bacterial Vaginosis Bacterial vaginosis is a vaginal infection that occurs when the normal balance of bacteria in the vagina is disrupted. It results from an overgrowth of certain bacteria. This is the most  common vaginal infection in women of childbearing age. Treatment is important to prevent complications, especially in pregnant women, as it can cause a premature delivery. CAUSES  Bacterial vaginosis is caused by an increase in harmful bacteria that are normally present in smaller amounts in the vagina. Several different kinds of bacteria can cause bacterial vaginosis. However, the reason that the condition develops is not fully understood. RISK FACTORS Certain activities or behaviors can put you at an increased risk of developing bacterial vaginosis, including:  Having a new sex partner or multiple sex partners.  Douching.  Using an intrauterine device (IUD) for contraception. Women do not get bacterial vaginosis from toilet seats, bedding, swimming pools, or contact with objects around them. SIGNS AND SYMPTOMS  Some women with bacterial vaginosis have no signs or symptoms. Common symptoms include:  Grey vaginal discharge.  A fishlike odor with discharge, especially after sexual intercourse.  Itching or burning of the vagina and vulva.  Burning or pain with urination. DIAGNOSIS  Your health care provider will take a medical history and examine the vagina for signs of bacterial vaginosis. A sample of vaginal fluid may be taken. Your health care provider will look at this sample under a microscope to check for bacteria and abnormal cells. A vaginal pH test may also be done.  TREATMENT  Bacterial vaginosis may be treated with antibiotic medicines. These may be given in the form of a pill   or a vaginal cream. A second round of antibiotics may be prescribed if the condition comes back after treatment.  HOME CARE INSTRUCTIONS   Only take over-the-counter or prescription medicines as directed by your health care provider.  If antibiotic medicine was prescribed, take it as directed. Make sure you finish it even if you start to feel better.  Do not have sex until treatment is  completed.  Tell all sexual partners that you have a vaginal infection. They should see their health care provider and be treated if they have problems, such as a mild rash or itching.  Practice safe sex by using condoms and only having one sex partner. SEEK MEDICAL CARE IF:   Your symptoms are not improving after 3 days of treatment.  You have increased discharge or pain.  You have a fever. MAKE SURE YOU:   Understand these instructions.  Will watch your condition.  Will get help right away if you are not doing well or get worse. FOR MORE INFORMATION  Centers for Disease Control and Prevention, Division of STD Prevention: www.cdc.gov/std American Sexual Health Association (ASHA): www.ashastd.org  Document Released: 01/18/2005 Document Revised: 11/08/2012 Document Reviewed: 08/30/2012 ExitCare Patient Information 2015 ExitCare, LLC. This information is not intended to replace advice given to you by your health care provider. Make sure you discuss any questions you have with your health care provider.  

## 2014-07-12 NOTE — Progress Notes (Signed)
Subjective:    Patient ID: Lauren Whitehead, female    DOB: 03/31/1980, 34 y.o.   MRN: [id number]  HPI  Lauren Whitehead, a 34 year old female presents with recurrent vaginal itching and discharge. Patient was treated for a vaginal yeast infection on 05/31/2014 with Diflucan. Vulvar symptoms include discharge described as white. Patient denies changing sexual partners recently. She states that she has not tried new soaps, lotions, or body washes. Patient reports external itching, but denies fever, fatigue, dyspareunia, vaginal itching, burning, or dysuria. Patient is not utilizing contraception. Patient has not attempted any over the counter interventions to alleviate symptoms.   Lauren Whitehead has a history of PCOS with elevated hemoglobin A1C. She was started on Metformin 500 mg BID. Patient reports that she discontinued medication due to dizziness. She currently denies dizziness, fatigue, shortness of breath, nausea, vomiting and diarrhea.   No past medical history on file.  History   Social History  . Marital Status: Married    Spouse Name: N/A  . Number of Children: N/A  . Years of Education: N/A   Occupational History  . Not on file.   Social History Main Topics  . Smoking status: Never Smoker   . Smokeless tobacco: Never Used  . Alcohol Use: No  . Drug Use: No  . Sexual Activity: Not Currently   Other Topics Concern  . Not on file   Social History Narrative  No Known Allergies Review of Systems  Constitutional: Negative.   HENT: Negative.   Eyes: Negative.   Respiratory: Negative.   Cardiovascular: Negative.   Gastrointestinal: Negative.   Endocrine: Negative.   Genitourinary: Positive for vaginal discharge (vaginal itching).  Musculoskeletal: Negative.   Skin: Negative.   Allergic/Immunologic: Negative.   Neurological: Negative.   Hematological: Negative.   Psychiatric/Behavioral: Negative.        Objective:   Physical Exam  Constitutional: She is oriented  to person, place, and time.  HENT:  Head: Normocephalic and atraumatic.  Right Ear: External ear normal.  Left Ear: External ear normal.  Mouth/Throat: Oropharynx is clear and moist.  Eyes: Conjunctivae are normal. Pupils are equal, round, and reactive to light.  Neck: Normal range of motion. Neck supple.  Cardiovascular: Normal rate, regular rhythm, normal heart sounds and intact distal pulses.   Pulmonary/Chest: Effort normal and breath sounds normal.  Abdominal: Soft. Bowel sounds are normal.  Genitourinary: Cervix exhibits discharge (thick, white discharge).  Musculoskeletal: Normal range of motion.  Neurological: She is alert and oriented to person, place, and time. She has normal reflexes.  Skin: Skin is warm and dry.  Psychiatric: She has a normal mood and affect. Her behavior is normal. Judgment and thought content normal.         BP 129/81 mmHg  Pulse 91  Temp(Src) 98.2 F (36.8 C) (Oral)  Resp 16  Ht  (1.575 m)  Wt 163 lb (73.936 kg)  BMI 29.81 kg/m2  LMP 06/08/2014 Assessment & Plan:  1. Encounter for routine gynecological examination - Cytology - PAP [place]  2. Vaginal discomfort Patient complaining of vaginal discharge and itching. She states that itching has been occurring off and on over the past several months.   3. Vaginal itching - POCT Wet Prep [facility])  4. Pre-diabetes Patient discontinued Metformin 500 mg BID several months ago. She states that the medication was making her dizzy. Check for proteinuria and glucosuria today, not present. Reviewed previous hemoglobin A1C - Basic Metabolic Panel -  POCT urinalysis dipstick - Hemoglobin A1c  5. Yeast infection of the vagina Will evaluate CBG and hemoglobin a1C due to recurrent vaginal yeast infections. Patient has 4-5 yeast per high power field on wetprep. Will start Difflucan 200 mg times 1 dose. Patient to repeat dose in 1 week is symptoms of itching recur.  - fluconazole (DIFLUCAN) 200  MG tablet; Take 1 tablet (200 mg total) by mouth daily.  Dispense: 2 tablet; Refill: 0  6. Bacterial vaginosis 2-3 clue cells per high power field on wet prep.  - metroNIDAZOLE (FLAGYL) 500 MG tablet; Take 1 tablet (500 mg total) by mouth 2 (two) times daily.  Dispense: 21 tablet; Refill: 0  Hollis,Lachina M, FNP  RTC: 3 months for pre-diabetes

## 2014-07-16 LAB — CERVICOVAGINAL ANCILLARY ONLY
Bacterial vaginitis: NEGATIVE
Candida vaginitis: NEGATIVE

## 2014-07-16 LAB — CYTOLOGY - PAP

## 2014-08-22 ENCOUNTER — Other Ambulatory Visit: Payer: No Typology Code available for payment source

## 2014-08-22 ENCOUNTER — Other Ambulatory Visit: Payer: Self-pay | Admitting: Family Medicine

## 2014-08-22 DIAGNOSIS — E119 Type 2 diabetes mellitus without complications: Secondary | ICD-10-CM

## 2014-08-22 LAB — LIPID PANEL
Cholesterol: 128 mg/dL (ref 0–200)
HDL: 34 mg/dL — ABNORMAL LOW (ref >=46)
LDL Cholesterol: 65 mg/dL (ref 0–99)
Total CHOL/HDL Ratio: 3.8 Ratio
Triglycerides: 143 mg/dL (ref <150)
VLDL: 29 mg/dL (ref 0–40)

## 2014-08-29 ENCOUNTER — Encounter: Payer: No Typology Code available for payment source | Admitting: Family Medicine

## 2014-10-31 ENCOUNTER — Telehealth: Payer: Self-pay | Admitting: Family Medicine

## 2014-10-31 NOTE — Telephone Encounter (Signed)
Received faxed request for medical records. Forwarded to [facility]. 

## 2016-01-17 IMAGING — US US TRANSVAGINAL NON-OB
1 series · 14 of 25 positions shown · non-contrast
Comparison: None

CLINICAL DATA: Oligomenorrhea.

EXAM:
TRANSABDOMINAL AND TRANSVAGINAL ULTRASOUND OF PELVIS
TECHNIQUE: Both transabdominal and transvaginal ultrasound examinations of the
pelvis were performed. Transabdominal technique was performed for
global imaging of the pelvis including uterus, ovaries, adnexal
regions, and pelvic cul-de-sac. It was necessary to proceed with
endovaginal exam following the transabdominal exam to visualize the
uterus and ovaries..

[Series 1: us transvaginal non-ob · 0.20mm/px · 14 of 95 slices shown]
[im 1/95]
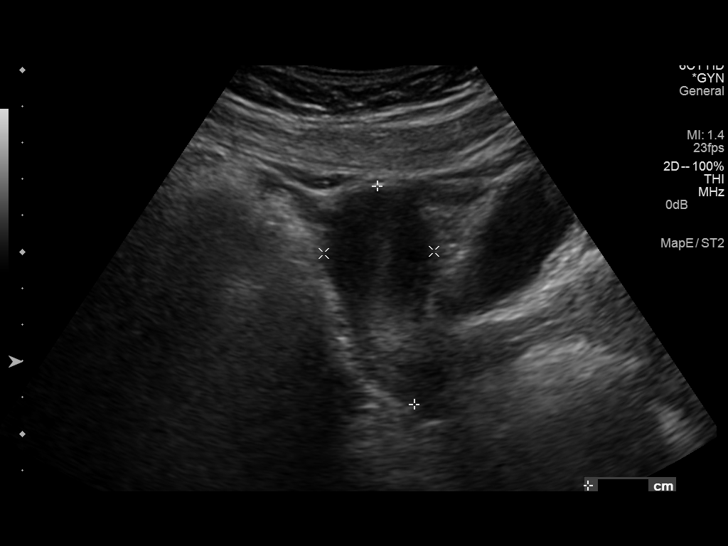
[im 8/95]
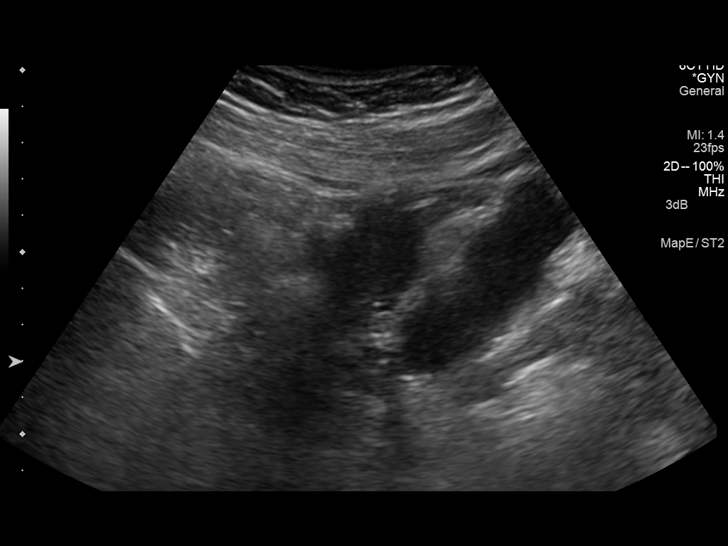
[im 16/95]
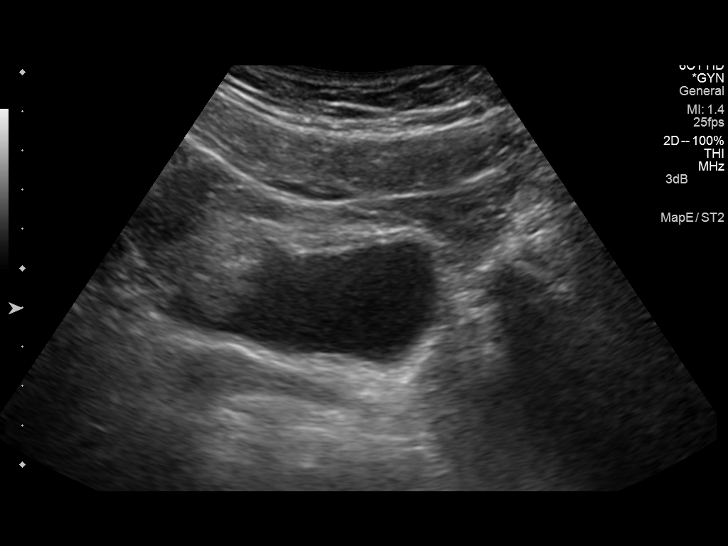
[im 24/95]
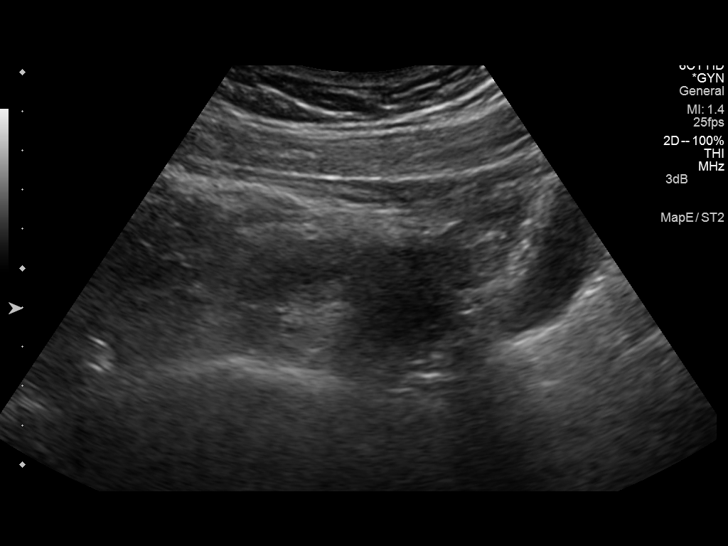
[im 32/95]
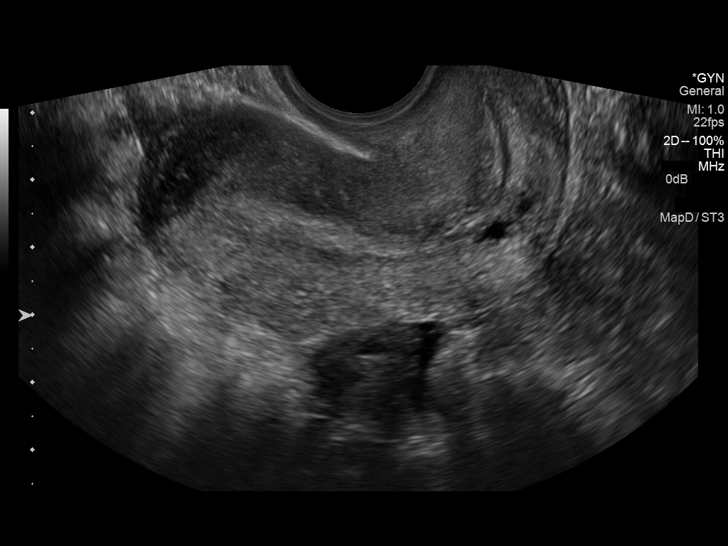
[im 36/95]
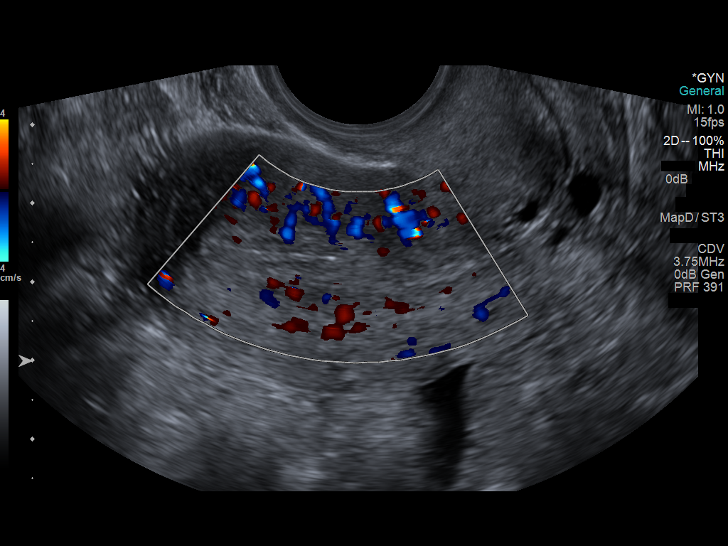
[im 44/95]
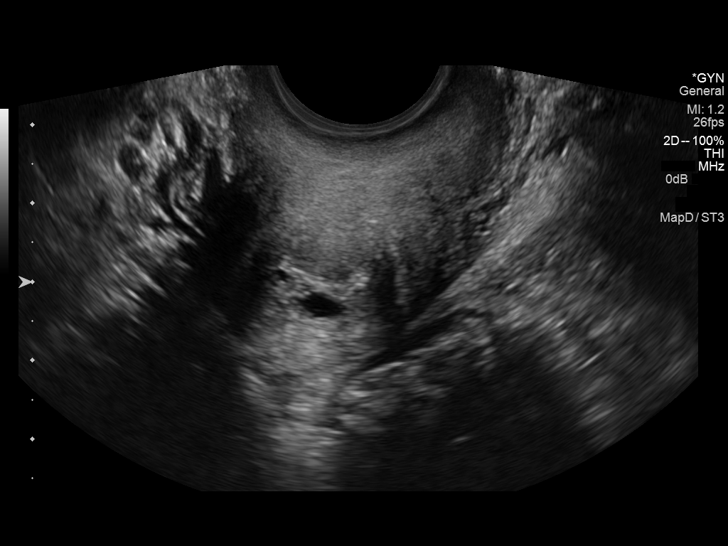
[im 51/95]
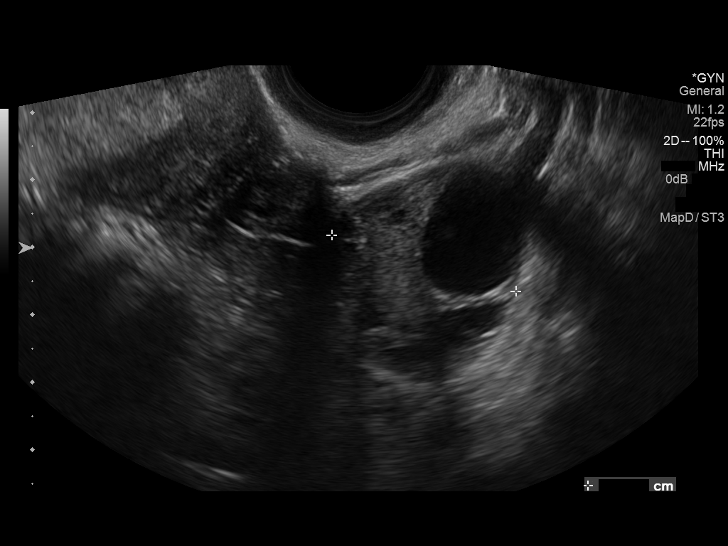
[im 59/95]
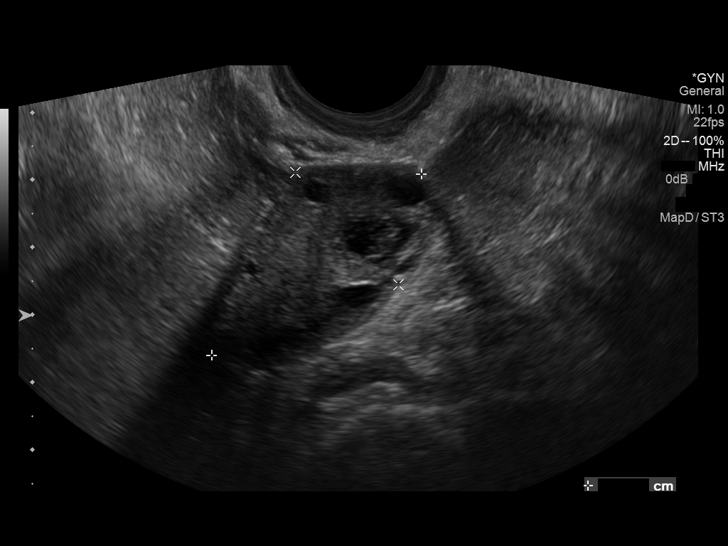
[im 63/95]
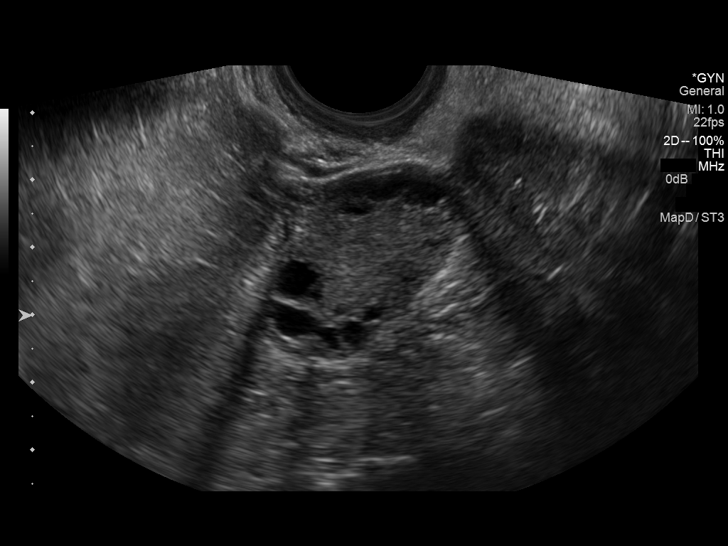
[im 71/95]
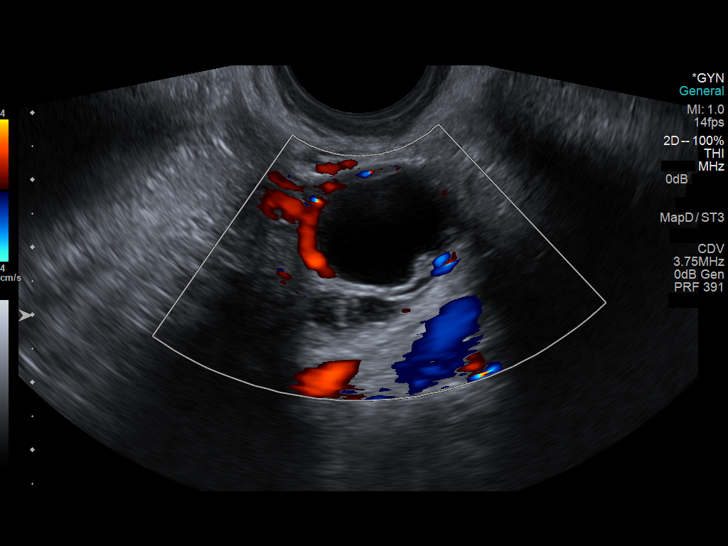
[im 79/95]
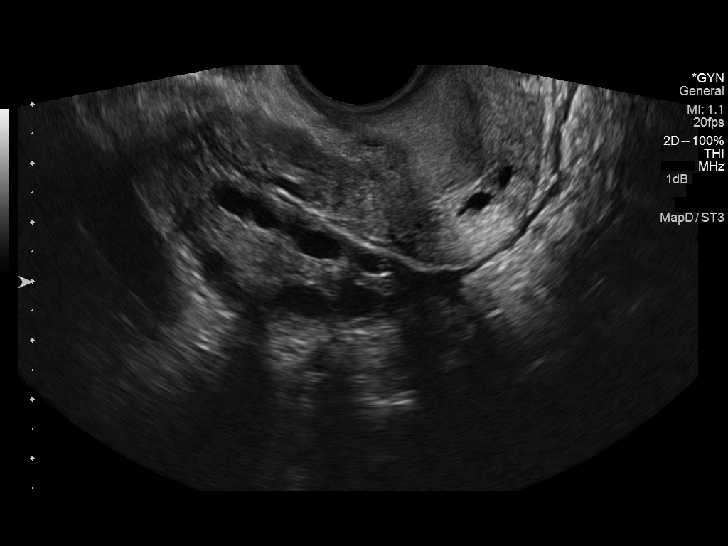
[im 87/95]
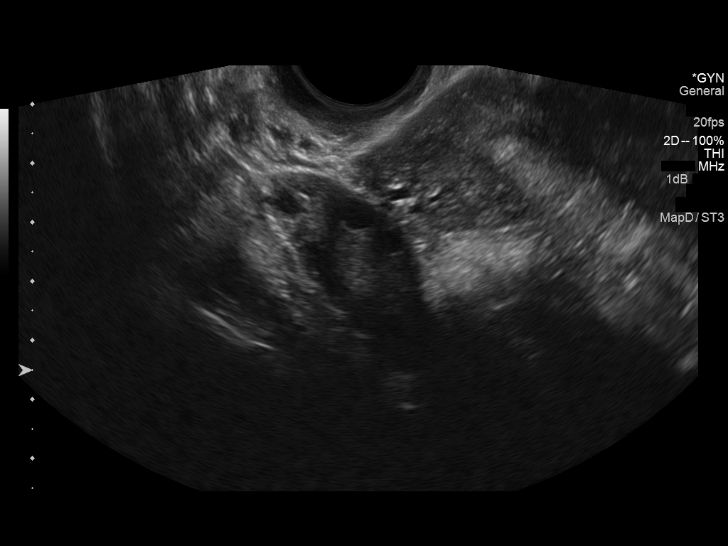
[im 95/95]
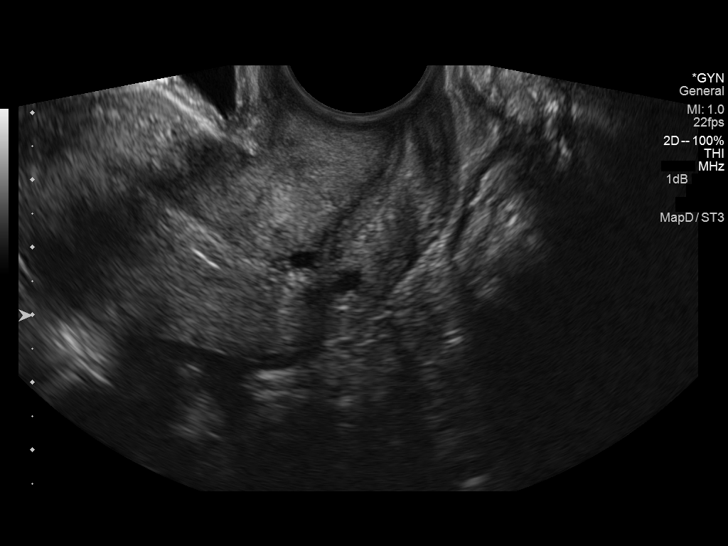

[14 of 25 positions shown; findings below may reference images not displayed]

FINDINGS: Uterus

Measurements: 6.1 x 3.1 x 4.2 cm. No fibroids or other mass
visualized.

Endometrium

Thickness: 7.9 mm.  No focal abnormality visualized.

Right ovary

Measurements: 4.2 x 1.7 x 1.7 cm. Multiple ovarian cysts many of
which are peripherally located.

Left ovary

Measurements: 4.1 x 2.3 x 2.9 cm. Multiple ovarian cysts many of
which are peripherally located. A 2.0 cm 7 cyst noted.

Other findings

Small amount of free pelvic fluid .
IMPRESSION: Findings suggestive of polycystic ovarian disease.
